# Patient Record
Sex: Female | Born: 1935 | Race: Black or African American | Hispanic: No | Smoking: Never smoker
Health system: Southern US, Community
[De-identification: ages and names within clinical notes are randomized; demographics above are authoritative.]

## PROBLEM LIST (undated history)

## (undated) DIAGNOSIS — I10 Essential (primary) hypertension: Secondary | ICD-10-CM

## (undated) DIAGNOSIS — C801 Malignant (primary) neoplasm, unspecified: Secondary | ICD-10-CM

---

## 2017-03-09 ENCOUNTER — Encounter (HOSPITAL_COMMUNITY): Payer: Self-pay | Admitting: Emergency Medicine

## 2017-03-09 ENCOUNTER — Emergency Department (HOSPITAL_COMMUNITY): Payer: Medicare Other

## 2017-03-09 ENCOUNTER — Emergency Department (HOSPITAL_COMMUNITY)
Admission: EM | Admit: 2017-03-09 | Discharge: 2017-03-09 | Disposition: A | Discharge status: Home / Self Care | Payer: Medicare Other | Attending: Physician Assistant | Admitting: Physician Assistant

## 2017-03-09 DIAGNOSIS — Z85038 Personal history of other malignant neoplasm of large intestine: Secondary | ICD-10-CM | POA: Insufficient documentation

## 2017-03-09 DIAGNOSIS — I10 Essential (primary) hypertension: Secondary | ICD-10-CM | POA: Insufficient documentation

## 2017-03-09 DIAGNOSIS — R55 Syncope and collapse: Secondary | ICD-10-CM | POA: Diagnosis present

## 2017-03-09 HISTORY — DX: Essential (primary) hypertension: I10

## 2017-03-09 HISTORY — DX: Malignant (primary) neoplasm, unspecified: C80.1

## 2017-03-09 LAB — CBC
HCT: 36.7 % (ref 36.0–46.0)
Hemoglobin: 11.5 g/dL — ABNORMAL LOW (ref 12.0–15.0)
MCH: 22.5 pg — AB (ref 26.0–34.0)
MCHC: 31.3 g/dL (ref 30.0–36.0)
MCV: 71.8 fL — ABNORMAL LOW (ref 78.0–100.0)
PLATELETS: 172 10*3/uL (ref 150–400)
RBC: 5.11 MIL/uL (ref 3.87–5.11)
RDW: 15 % (ref 11.5–15.5)
WBC: 6.7 10*3/uL (ref 4.0–10.5)

## 2017-03-09 LAB — CBG MONITORING, ED: GLUCOSE-CAPILLARY: 121 mg/dL — AB (ref 65–99)

## 2017-03-09 LAB — COMPREHENSIVE METABOLIC PANEL
ALBUMIN: 3.4 g/dL — AB (ref 3.5–5.0)
ALK PHOS: 59 U/L (ref 38–126)
ALT: 12 U/L — ABNORMAL LOW (ref 14–54)
AST: 22 U/L (ref 15–41)
Anion gap: 6 (ref 5–15)
BILIRUBIN TOTAL: 0.7 mg/dL (ref 0.3–1.2)
BUN: 14 mg/dL (ref 6–20)
CALCIUM: 8.8 mg/dL — AB (ref 8.9–10.3)
CO2: 30 mmol/L (ref 22–32)
Chloride: 103 mmol/L (ref 101–111)
Creatinine, Ser: 0.97 mg/dL (ref 0.44–1.00)
GFR calc Af Amer: >60 mL/min (ref >60)
GFR calc non Af Amer: 53 mL/min — ABNORMAL LOW (ref >60)
GLUCOSE: 121 mg/dL — AB (ref 65–99)
Potassium: 3.3 mmol/L — ABNORMAL LOW (ref 3.5–5.1)
SODIUM: 139 mmol/L (ref 135–145)
TOTAL PROTEIN: 6.6 g/dL (ref 6.5–8.1)

## 2017-03-09 LAB — I-STAT TROPONIN, ED: Troponin i, poc: 0 ng/mL (ref 0.00–0.08)

## 2017-03-09 LAB — I-STAT CG4 LACTIC ACID, ED: Lactic Acid, Venous: 0.8 mmol/L (ref 0.5–1.9)

## 2017-03-09 NOTE — ED Notes (Signed)
Pt ambulated in hallway, no issues. Notified Dr. Thomasene Lot.

## 2017-03-09 NOTE — ED Notes (Signed)
Patient transported to CT 

## 2017-03-09 NOTE — Discharge Instructions (Signed)
Please return immediately if any having chest pain, shortness breath, lightheadedness, or other complaints.

## 2017-03-09 NOTE — ED Provider Notes (Signed)
[place] DEPT Provider Note   CSN: [id number] Arrival date & time: 03/09/17  1332     History   Chief Complaint Chief Complaint  Patient presents with  . Near Syncope    HPI Diane Mitchell is a 83 y.o. female.  HPI   Patient is a 83 year old female presenting today with syncope. Patient was in her usual state of health when he. She was having breakfast with her daughters and granddaughters at 46 AM. Patient did just finish her meal when she all of a sudden felt lightheaded she notes the room that she felt she was going to pass out. Then she slumped forward in her seat. Afterwards patient was alert and oriented. No postictal state.  Past Medical History:  Diagnosis Date  . Cancer [facility])    Colon  cancer  . Hypertension     There are no active problems to display for this patient.   History reviewed. No pertinent surgical history.  OB History    No data available       Home Medications    Prior to Admission medications   Not on File    Family History No family history on file.  Social History Social History  Substance Use Topics  . Smoking status: Never Smoker  . Smokeless tobacco: Never Used  . Alcohol use No     Allergies   Demerol [meperidine]   Review of Systems Review of Systems   Physical Exam Updated Vital Signs BP 132/70 (BP Location: Right Arm) Comment: Simultaneous filing. User may not have seen previous data.  Pulse (!) 47 Comment: Simultaneous filing. User may not have seen previous data.  Temp 97.5 F (36.4 C) (Oral)   Resp 16 Comment: Simultaneous filing. User may not have seen previous data.  Ht 5\' 10"  (1.778 m)   Wt 83.9 kg (185 lb)   SpO2 92% Comment: Simultaneous filing. User may not have seen previous data.  BMI 26.54 kg/m   Physical Exam  Constitutional: She is oriented to person, place, and time. She appears well-developed and well-nourished.  HENT:  Head: Normocephalic and atraumatic.  Eyes: EOM are normal.  Pupils are equal, round, and reactive to light. Right eye exhibits no discharge.  Cardiovascular: Normal rate and regular rhythm.   Pulmonary/Chest: Effort normal and breath sounds normal. No respiratory distress. She has no wheezes.  Abdominal: Soft. She exhibits no distension. There is no tenderness.  Musculoskeletal: She exhibits edema.  +1 edema bilaterally  Neurological: She is oriented to person, place, and time.  Equal strength bilaterally upper and lower extremities negative pronator drift. Normal sensation bilaterally. Speech comprehensible, no slurring. Facial nerve tested and appears grossly normal. Alert and oriented 3.   Skin: Skin is warm and dry. She is not diaphoretic.  Psychiatric: She has a normal mood and affect.  Nursing note and vitals reviewed.    ED Treatments / Results  Labs (all labs ordered are listed, but only abnormal results are displayed) Labs Reviewed  CBC - Abnormal; Notable for the following:       Result Value   Hemoglobin 11.5 (*)    MCV 71.8 (*)    MCH 22.5 (*)    All other components within normal limits  COMPREHENSIVE METABOLIC PANEL - Abnormal; Notable for the following:    Potassium 3.3 (*)    Glucose, Bld 121 (*)    Calcium 8.8 (*)    Albumin 3.4 (*)    ALT 12 (*)    GFR calc  non Af Amer 53 (*)    All other components within normal limits  CBG MONITORING, ED - Abnormal; Notable for the following:    Glucose-Capillary 121 (*)    All other components within normal limits  URINALYSIS, ROUTINE W REFLEX MICROSCOPIC  I-STAT TROPOININ, ED  I-STAT CG4 LACTIC ACID, ED    EKG  EKG Interpretation  Date/Time:  Monday Mar 09 2017 13:48:25 EDT Ventricular Rate:  53 PR Interval:    QRS Duration: 96 QT Interval:  463 QTC Calculation: 435 R Axis:   15 Text Interpretation:  Sinus rhythm Prolonged PR interval slow rate noted Confirmed by Thomasene Lot, [place] [phone]) on 03/09/2017 3:44:13 PM       Radiology Dg Chest 2 View  Result Date:  03/09/2017 CLINICAL DATA:  Syncope, altered mental status, weakness, confusion, nausea/vomiting EXAM: CHEST  2 VIEW COMPARISON:  None. FINDINGS: Lungs are clear.  No pleural effusion or pneumothorax. The heart is normal in size. Visualized osseous structures are within normal limits. IMPRESSION: No evidence of acute cardiopulmonary disease. Electronically Signed   By: Julian Hy M.D.   On: 03/09/2017 15:04   Ct Head Wo Contrast  Result Date: 03/09/2017 CLINICAL DATA:  Syncope, nausea/vomiting EXAM: CT HEAD WITHOUT CONTRAST TECHNIQUE: Contiguous axial images were obtained from the base of the skull through the vertex without intravenous contrast. COMPARISON:  None. FINDINGS: Brain: No evidence of acute infarction, hemorrhage, hydrocephalus, or extra-axial collection. 12 mm calcified extra-axial lesion along the right tentorium (series 3/ image 14; coronal image 50), compatible with a benign meningioma. No mass effect or vasogenic edema. Subcortical white matter and periventricular small vessel ischemic changes. Vascular: No hyperdense vessel or unexpected calcification. Skull: Normal. Negative for fracture or focal lesion. Sinuses/Orbits: The visualized paranasal sinuses are essentially clear. The mastoid air cells are unopacified. Other: None. IMPRESSION: No evidence of acute intracranial abnormality. 12 mm calcified meningioma along the right tentorium. Small vessel ischemic changes. Electronically Signed   By: Julian Hy M.D.   On: 03/09/2017 15:18    Procedures Procedures (including critical care time)  Medications Ordered in ED Medications - No data to display   Initial Impression / Assessment and Plan / ED Course  I have reviewed the triage vital signs and the nursing notes.  Pertinent labs & imaging results that were available during my care of the patient were reviewed by me and considered in my medical decision making (see chart for details).     Patient is well-appearing  83 year old female presenting with syncope. Patient was in her usual state of health and had breakfast this morning when she felt lightheaded like she was going to pass out and then did pass out. Patient had just eaten a large breakfast. Unclear whether shuntig of of blood to their abdomen after large meal may have caused lightheadedness?  Patietn was not post ictal, doubt siezure. Sounds vasovagal,.  Patient brought here to emergency department. No focal neurologic deficit. Patient feels completely improved. Lab work reassuring. EKG shows no evidence of ischemia. No tachycardia or leg swelling suggestive of pulmonary embolism. CT head shows small meningioma. Had patient centered discussion about admission versus discharge. Discussed with patient, daughter, granddaughter.   We'll discharge patient and she has follow-up with her cancer physician tomorrow morning.    Final Clinical Impressions(s) / ED Diagnoses   Final diagnoses:  None    New Prescriptions New Prescriptions   No medications on file     Macarthur Critchley, MD 03/09/17 1558

## 2017-03-09 NOTE — ED Triage Notes (Signed)
Per EMS:  Pt here from out of town visiting family.  While having lunch with daughter, pt felt faint and dizzy.  Pt had a significant amount of emesis en route to hospital.  Pt family states pt had a syncopal episode although pt denies passing out.  Pt was given 4mg  of Zofran PTA.

## 2017-03-09 NOTE — ED Notes (Signed)
Pt CBG was 121, notified Cynthia(RN) & Chelsea(RN)

## 2017-12-06 IMAGING — CR DG CHEST 2V
2 series · 2 of 2 positions shown · non-contrast
Comparison: None.

CLINICAL DATA: Syncope, altered mental status, weakness, confusion,
nausea/vomiting

EXAM:
CHEST  2 VIEW

[chest lat]
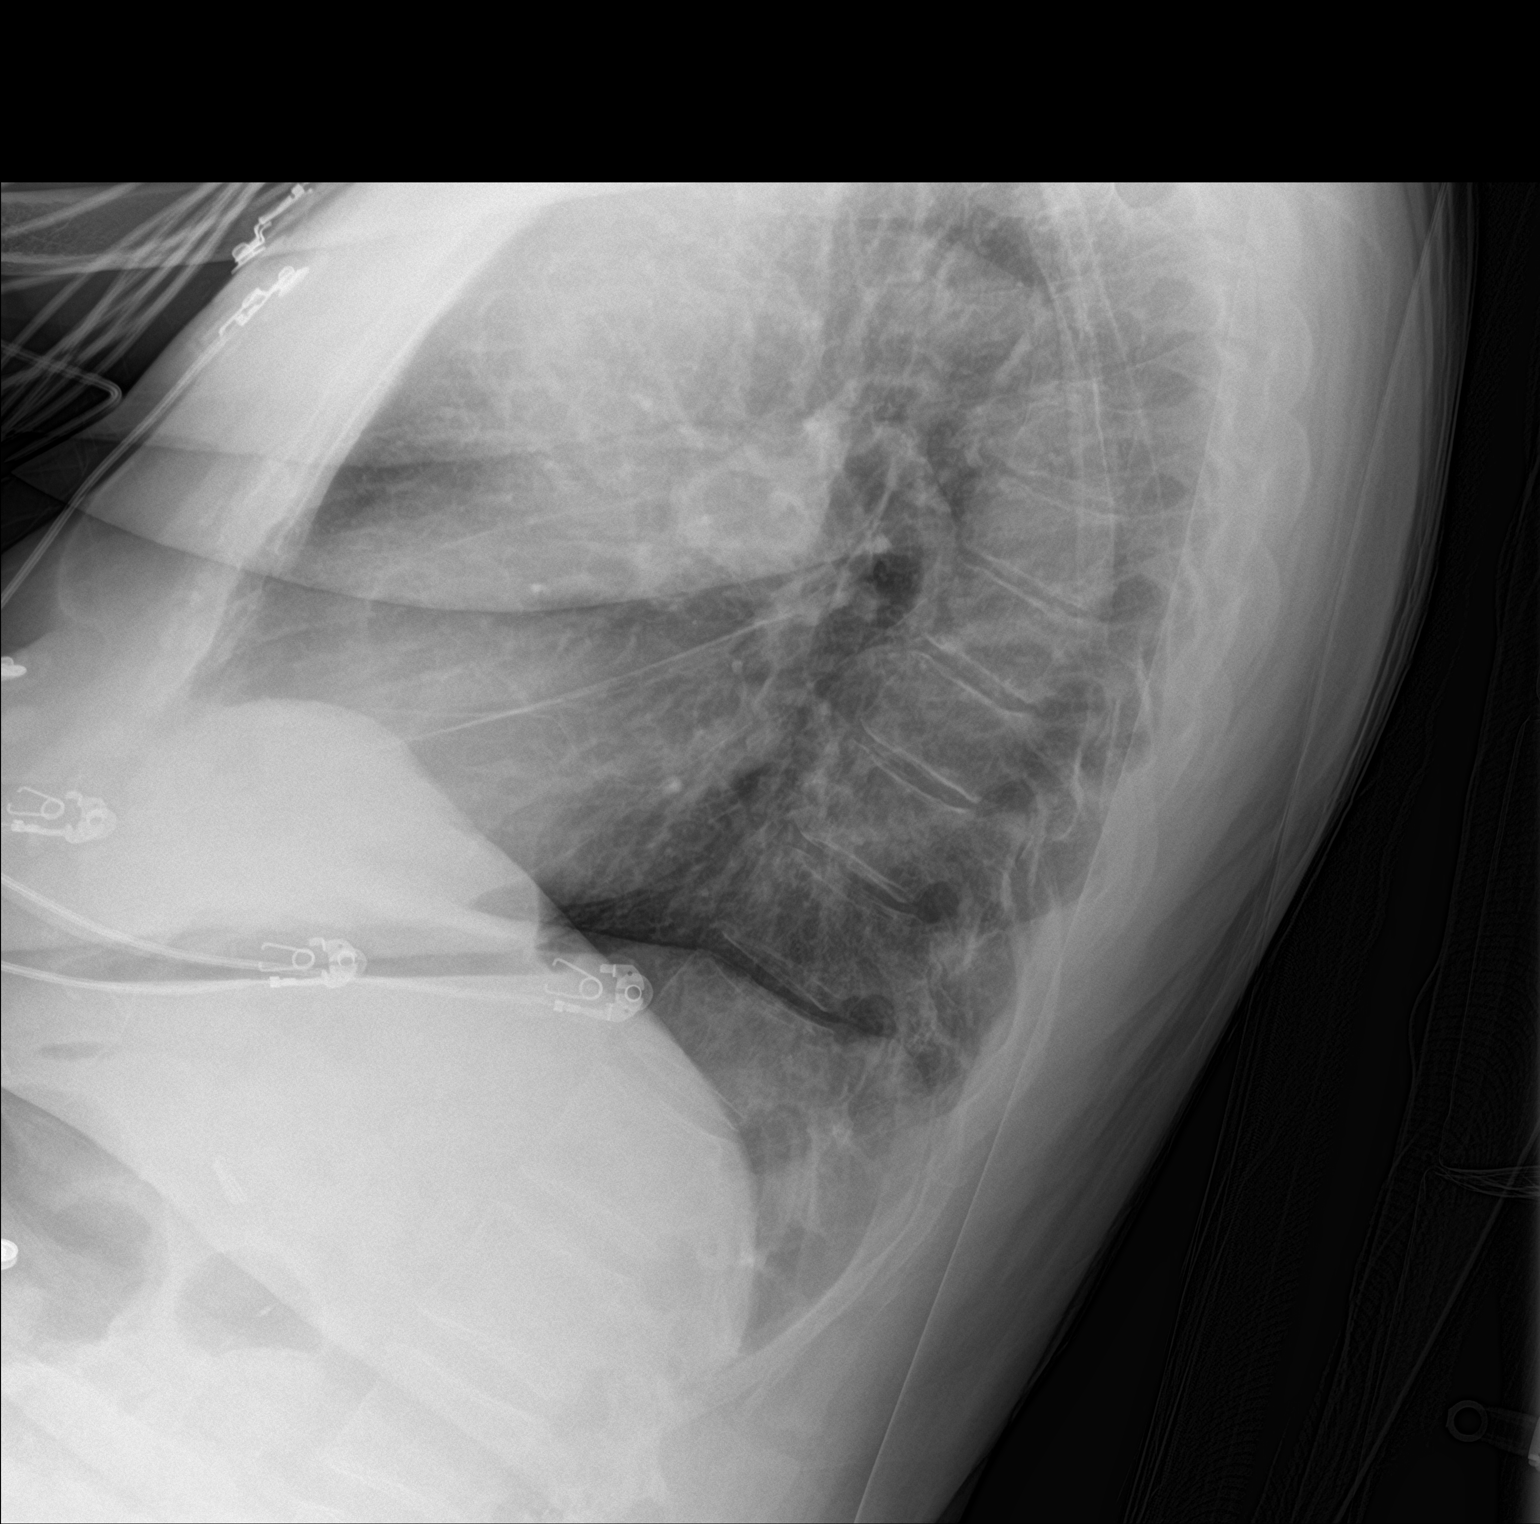

[chest ap]
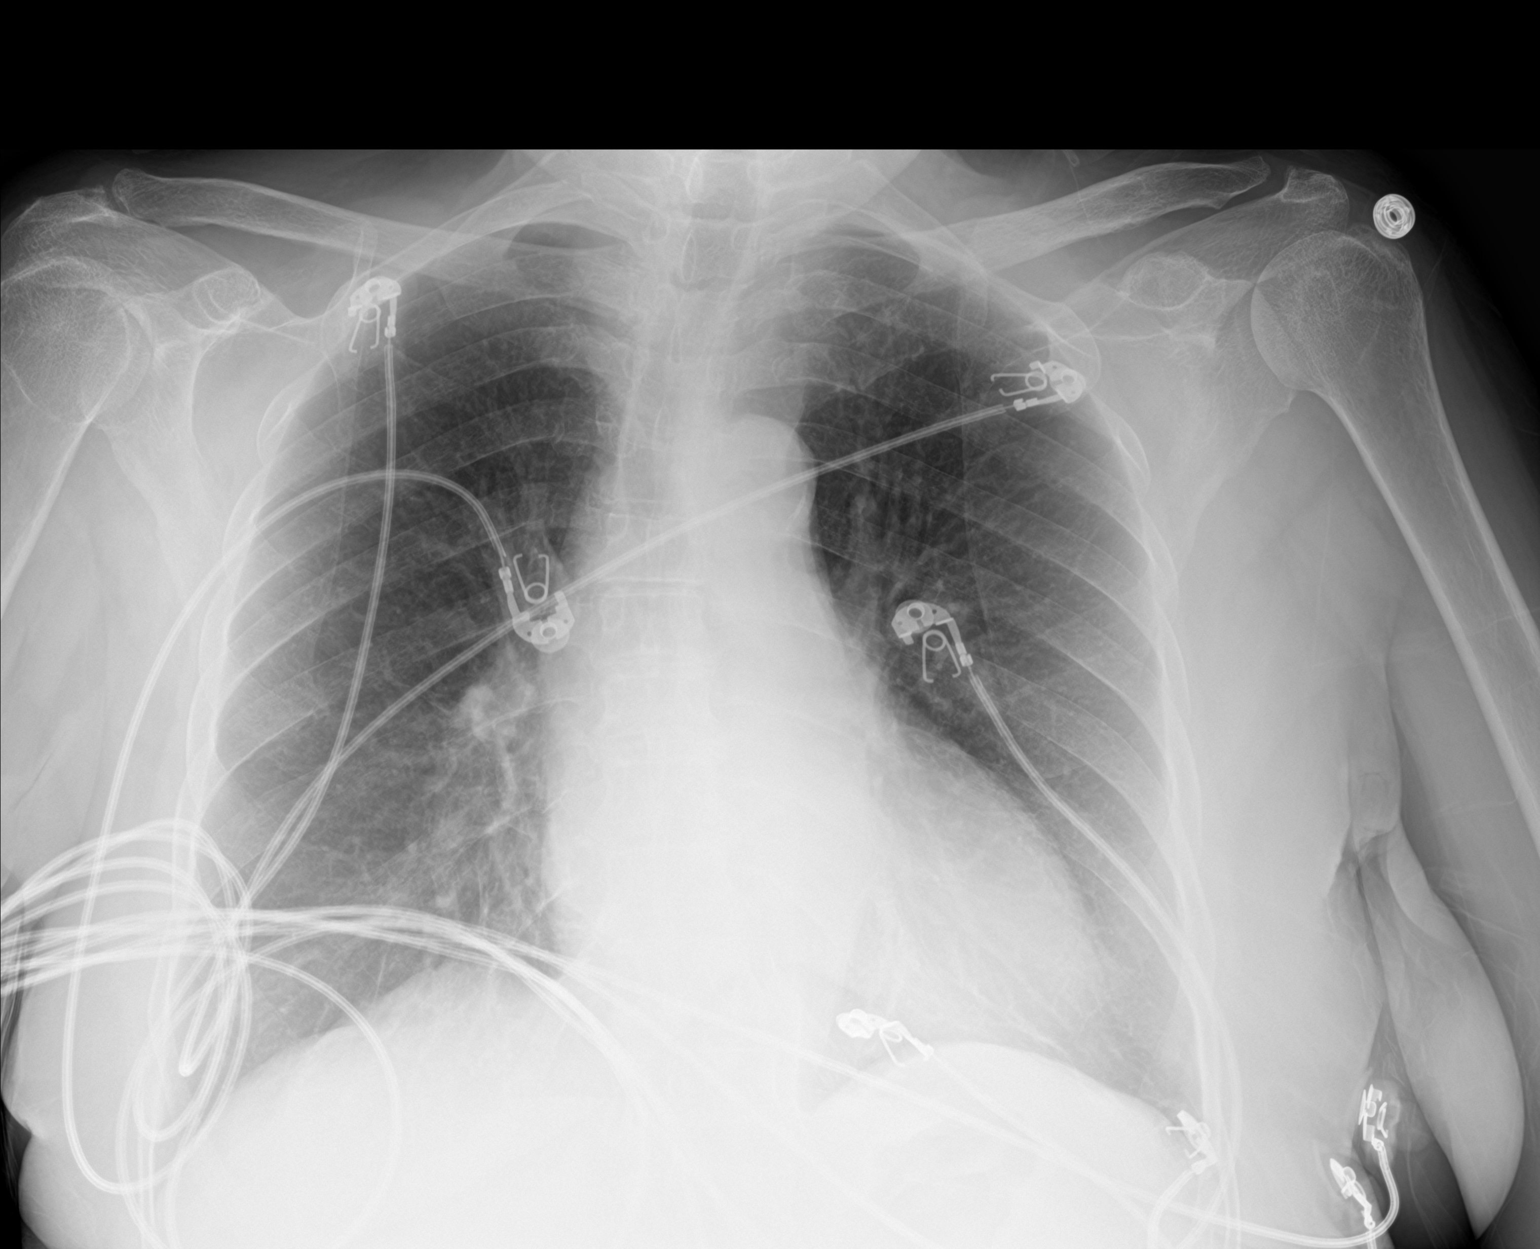

[2 of 2 positions shown; findings below may reference images not displayed]

FINDINGS: Lungs are clear.  No pleural effusion or pneumothorax.

The heart is normal in size.

Visualized osseous structures are within normal limits.
IMPRESSION: No evidence of acute cardiopulmonary disease.

## 2017-12-06 IMAGING — CT CT HEAD W/O CM
3 series · 15 of 47 positions shown, 18 images · non-contrast
Comparison: None.

CLINICAL DATA: Syncope, nausea/vomiting

EXAM:
CT HEAD WITHOUT CONTRAST
TECHNIQUE: Contiguous axial images were obtained from the base of the skull
through the vertex without intravenous contrast.

[Series 3: head 5.0 h30s · axial · 0.46mm/px · z∈[+975,+1110]mm · 9 of 33 slices shown, 12 images]
[im 3/33  brain]
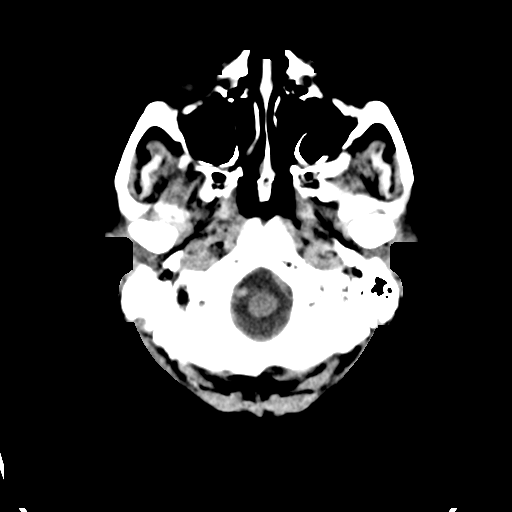
[im 3/33  bone]
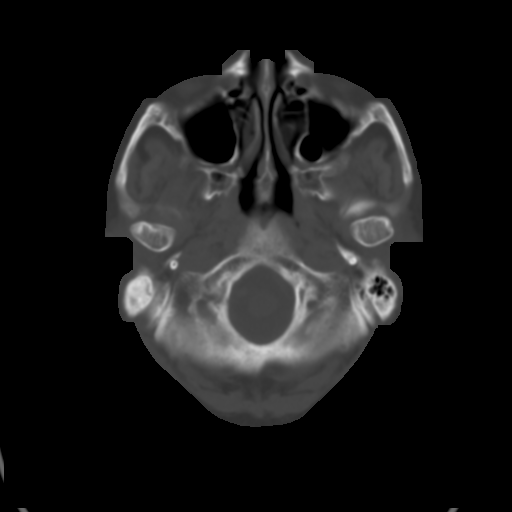
[im 6/33  brain]
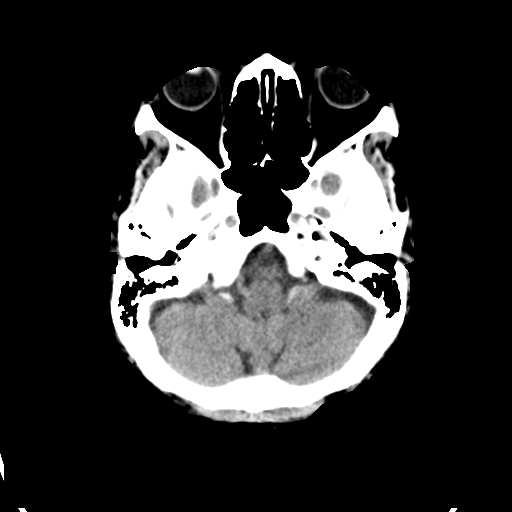
[im 9/33  brain]
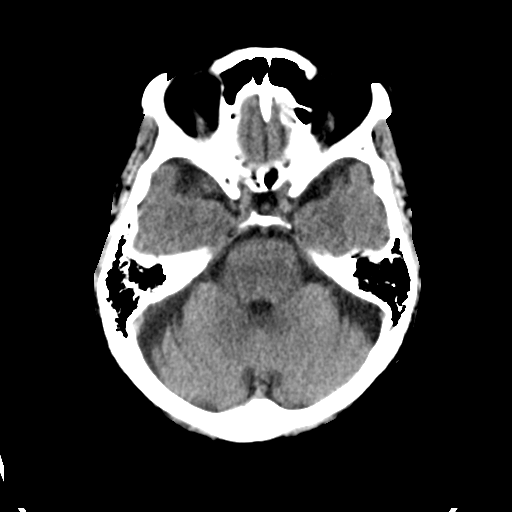
[im 13/33  brain]
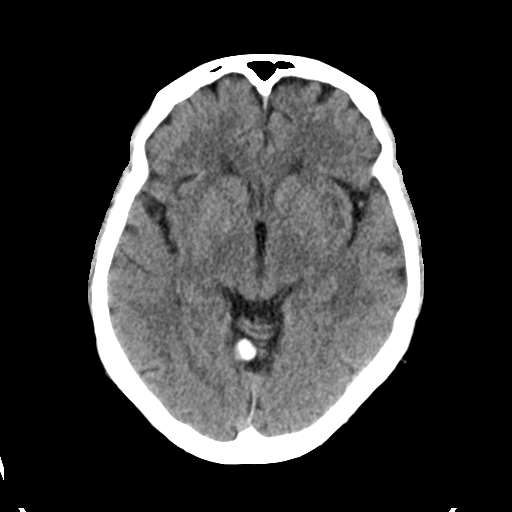
[im 17/33  brain]
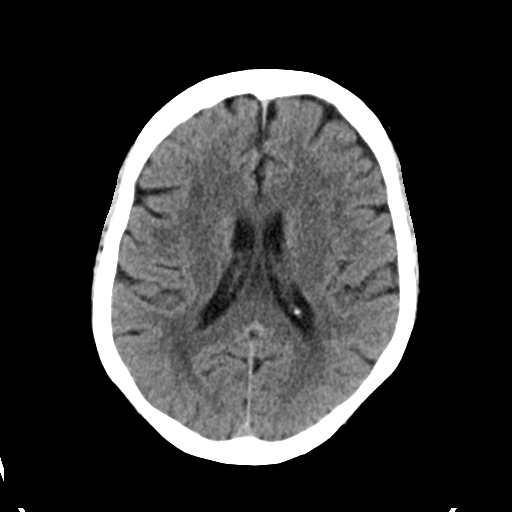
[im 17/33  bone]
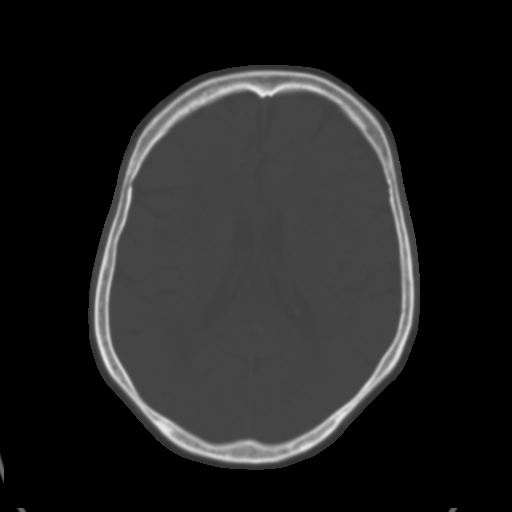
[im 20/33  brain]
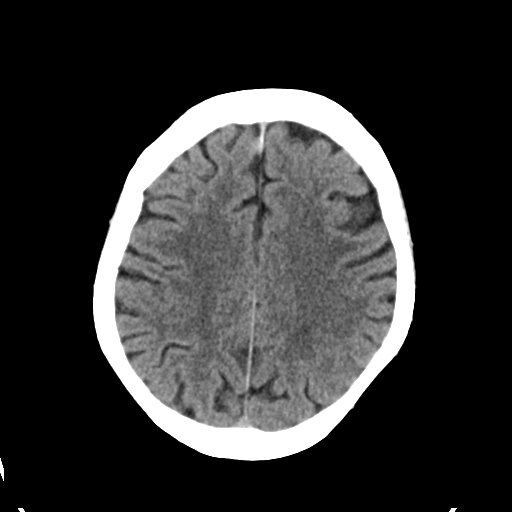
[im 24/33  brain]
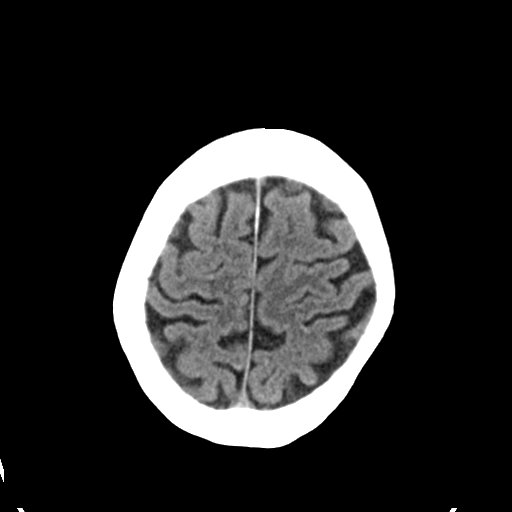
[im 27/33  brain]
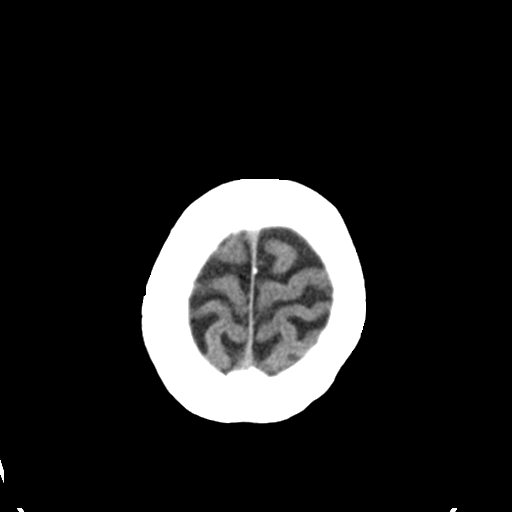
[im 30/33  brain]
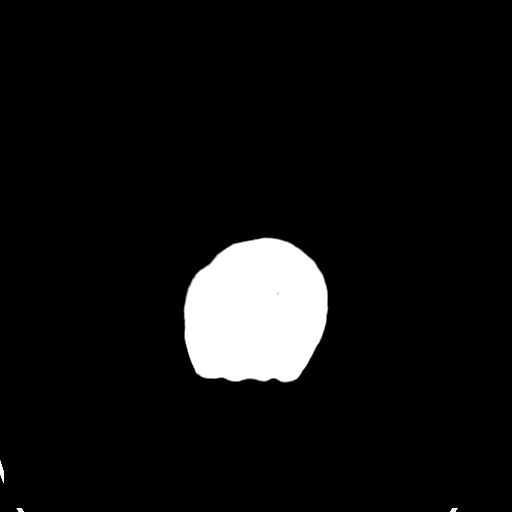
[im 30/33  bone]
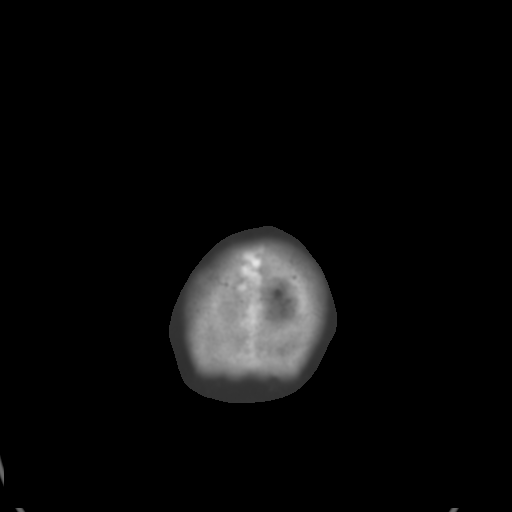

[Series 5: head 3.0 mpr cor · coronal · 0.30mm/px · 3 of 67 slices shown]
[im 23/67  brain]
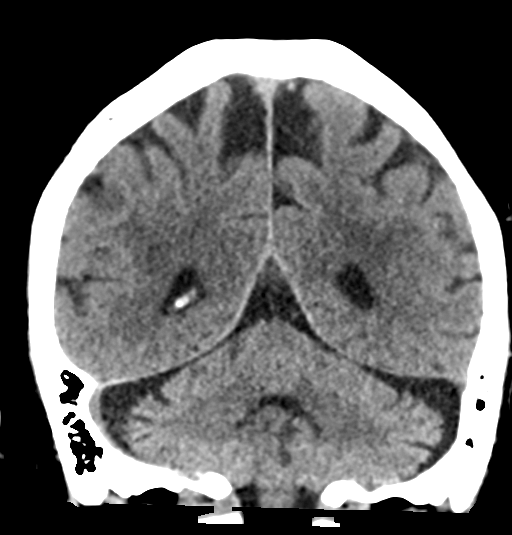
[im 30/67  brain]
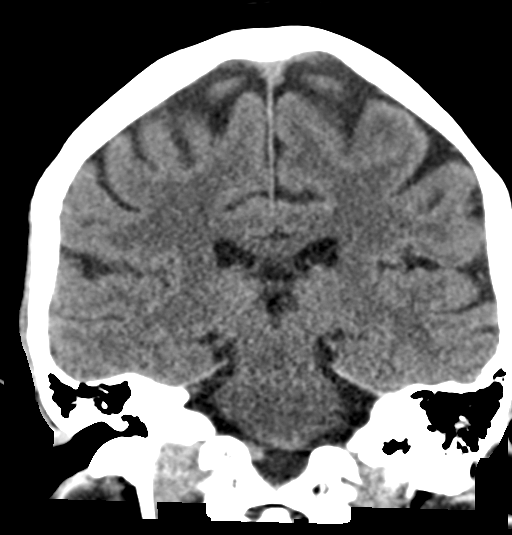
[im 37/67  brain]
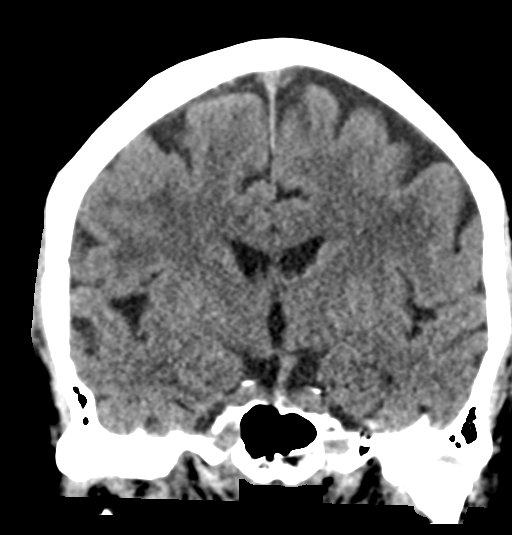

[Series 6: head 3.0 mpr sag · sagittal · 0.32mm/px · 3 of 50 slices shown]
[im 17/50  brain]
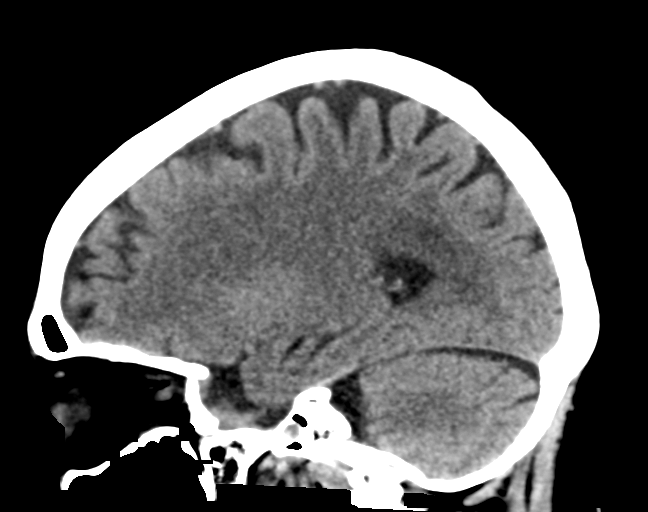
[im 25/50  brain]
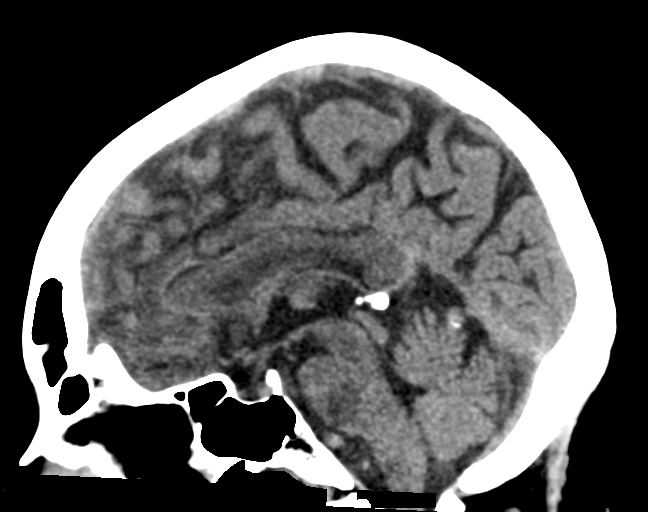
[im 33/50  brain]
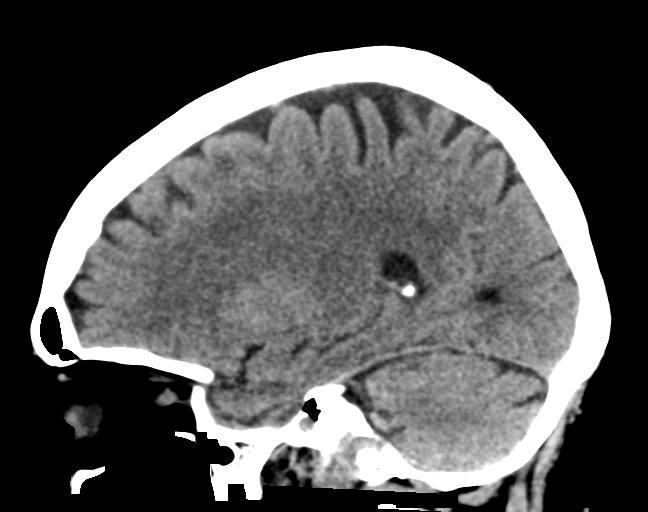

[15 of 47 positions shown; findings below may reference images not displayed]

FINDINGS: Brain: No evidence of acute infarction, hemorrhage, hydrocephalus,
or extra-axial collection.

12 mm calcified extra-axial lesion along the right tentorium (series
3/ image 14; coronal image 50), compatible with a benign meningioma.
No mass effect or vasogenic edema.

Subcortical white matter and periventricular small vessel ischemic
changes.

Vascular: No hyperdense vessel or unexpected calcification.

Skull: Normal. Negative for fracture or focal lesion.

Sinuses/Orbits: The visualized paranasal sinuses are essentially
clear. The mastoid air cells are unopacified.

Other: None.
IMPRESSION: No evidence of acute intracranial abnormality.

12 mm calcified meningioma along the right tentorium.

Small vessel ischemic changes.
